# Patient Record
Sex: Male | Born: 1995 | Race: White | Hispanic: No | Marital: Single | Smoking: Never smoker
Health system: Southern US, Community
[De-identification: ages and names within clinical notes are randomized; demographics above are authoritative.]

## PROBLEM LIST (undated history)

## (undated) DIAGNOSIS — R011 Cardiac murmur, unspecified: Secondary | ICD-10-CM

## (undated) DIAGNOSIS — S83511A Sprain of anterior cruciate ligament of right knee, initial encounter: Secondary | ICD-10-CM

## (undated) DIAGNOSIS — R569 Unspecified convulsions: Secondary | ICD-10-CM

## (undated) DIAGNOSIS — H539 Unspecified visual disturbance: Secondary | ICD-10-CM

## (undated) DIAGNOSIS — E669 Obesity, unspecified: Secondary | ICD-10-CM

## (undated) HISTORY — PX: KNEE SURGERY: SHX244

---
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
## 2021-04-23 ENCOUNTER — Encounter (HOSPITAL_COMMUNITY): Payer: Self-pay

## 2021-04-23 ENCOUNTER — Emergency Department (HOSPITAL_COMMUNITY)
Admission: EM | Admit: 2021-04-23 | Discharge: 2021-04-23 | Disposition: A | Discharge status: Home / Self Care | Payer: No Typology Code available for payment source | Attending: Emergency Medicine | Admitting: Emergency Medicine

## 2021-04-23 ENCOUNTER — Other Ambulatory Visit: Payer: Self-pay

## 2021-04-23 DIAGNOSIS — Z23 Encounter for immunization: Secondary | ICD-10-CM | POA: Insufficient documentation

## 2021-04-23 DIAGNOSIS — Z7982 Long term (current) use of aspirin: Secondary | ICD-10-CM | POA: Insufficient documentation

## 2021-04-23 DIAGNOSIS — W260XXA Contact with knife, initial encounter: Secondary | ICD-10-CM | POA: Diagnosis not present

## 2021-04-23 DIAGNOSIS — S59912A Unspecified injury of left forearm, initial encounter: Secondary | ICD-10-CM | POA: Diagnosis present

## 2021-04-23 DIAGNOSIS — S51812A Laceration without foreign body of left forearm, initial encounter: Secondary | ICD-10-CM | POA: Insufficient documentation

## 2021-04-23 MED ORDER — POVIDONE-IODINE 10 % EX SOLN: CUTANEOUS | Status: DC | PRN

## 2021-04-23 MED ORDER — LIDOCAINE-EPINEPHRINE (PF) 2 %-1:200000 IJ SOLN
10.0000 mL | Freq: Once | INTRAMUSCULAR | Status: AC
  Administered 2021-04-23: 10 mL via INTRADERMAL
  Filled 2021-04-23: qty 20

## 2021-04-23 MED ORDER — TETANUS-DIPHTH-ACELL PERTUSSIS 5-2.5-18.5 LF-MCG/0.5 IM SUSY
0.5000 mL | PREFILLED_SYRINGE | Freq: Once | INTRAMUSCULAR | Status: AC
Start: 2021-04-23 — End: 2021-04-23
  Administered 2021-04-23: 0.5 mL via INTRAMUSCULAR
  Filled 2021-04-23: qty 0.5

## 2021-04-23 NOTE — ED Provider Notes (Signed)
?[facility] ?Provider Note ? ? ?CSN: [id number] ?Arrival date & time: 04/23/21  0856 ? ?  ? ?History ? ?Chief Complaint  ?Patient presents with  ? Laceration  ? ? ?Joel Garcia is a 26 y.o. male. ? ? ?Laceration ? ?  ? ? ?Joel Garcia is a 26 y.o. male who presents to the Emergency Department complaining of laceration of the left forearm that occurred shortly before ER arrival.  States this is a work-related injury.  He was using a knife when the knife slipped causing the laceration.  Bleeding controlled prior to arrival.  He complains of some soreness to his forearm area.  He denies any pain numbness or weakness of his wrist hand or fingers.  Last Td is unknown. ? ? ?Home Medications ?Prior to Admission medications   ?Medication Sig Start Date End Date Taking? Authorizing Provider  ?aspirin 325 MG tablet Take 1 tablet (325 mg total) by mouth daily. 12/09/14   Cammy Copa, MD  ?methocarbamol (ROBAXIN) 500 MG tablet Take 1 tablet (500 mg total) by mouth 4 (four) times daily. 12/09/14   Cammy Copa, MD  ?oxyCODONE (OXY IR/ROXICODONE) 5 MG immediate release tablet Take 1-2 tablets (5-10 mg total) by mouth every 3 (three) hours as needed for breakthrough pain. 12/09/14   Cammy Copa, MD  ?   ? ?Allergies    ?Patient has no known allergies.   ? ?Review of Systems   ?Review of Systems  ?Musculoskeletal:  Negative for arthralgias.  ?Skin:  Positive for wound.  ?     Laceration left forearm  ?Neurological:  Negative for weakness and numbness.  ?All other systems reviewed and are negative. ? ?Physical Exam ?Updated Vital Signs ?BP 140/89 (BP Location: Left Arm)   Pulse 90   Temp 98.5 ?F (36.9 ?C)   Resp 20   Ht 5\' 11"  (1.803 m)   Wt 113.4 kg   SpO2 98%   BMI 34.87 kg/m?  ?Physical Exam ?Vitals and nursing note reviewed.  ?Constitutional:   ?   General: He is not in acute distress. ?   Appearance: Normal appearance. He is not ill-appearing or toxic-appearing.  ?Cardiovascular:   ?   Rate and Rhythm: Normal rate and regular rhythm.  ?   Pulses: Normal pulses.  ?Pulmonary:  ?   Effort: Pulmonary effort is normal.  ?Musculoskeletal:     ?   General: Signs of injury present.  ?   Left hand: No tenderness or bony tenderness. Normal range of motion. Normal strength. Normal sensation. There is no disruption of two-point discrimination. Normal capillary refill. Normal pulse.  ?   Comments: FROM of all fingers of the left hand.  No tenderness with ROM of the left wrist.  Grip strength strong   ?Skin: ?   General: Skin is warm.  ?   Capillary Refill: Capillary refill takes less than 2 seconds.  ?   Comments: 8 cm laceration to the palmar aspect of the left forearm. No edema or active bleeding. Wound explored no foreign bodies.    ?Neurological:  ?   General: No focal deficit present.  ?   Mental Status: He is alert.  ?   Sensory: No sensory deficit.  ?   Motor: No weakness.  ? ? ?ED Results / Procedures / Treatments   ?Labs ?(all labs ordered are listed, but only abnormal results are displayed) ?Labs Reviewed - No data to display ? ?EKG ?None ? ?Radiology ?No results  found. ? ?Procedures ?Procedures  ? ? ? ?LACERATION REPAIR ?Performed by: Lekita Kerekes ?Authorized by: Aylee Littrell ?Consent: Verbal consent obtained. ?Risks and benefits: risks, benefits and alternatives were discussed ?Consent given by: patient ?Patient identity confirmed: provided demographic data ?Prepped and Draped in normal sterile fashion ?Wound explored ? ?Laceration Location: left forearm ? ?Laceration Length: 8 cm ? ?No Foreign Bodies seen or palpated ? ?Anesthesia: local infiltration ? ?Local anesthetic: lidocaine 2% with epinephrine ? ?Anesthetic total: 6 ml ? ?Irrigation method: syringe ?Amount of cleaning: standard ? ?Skin closure: prolene ? ?Number of sutures: 11 ? ?Technique: simple interrupted ? ?Patient tolerance: Patient tolerated the procedure well with no immediate complications. ? ?Medications Ordered in  ED ?Medications  ?lidocaine-EPINEPHrine (XYLOCAINE W/EPI) 2 %-1:200000 (PF) injection 10 mL (has no administration in time range)  ?povidone-iodine (BETADINE) 10 % external solution (has no administration in time range)  ?Tdap (BOOSTRIX) injection 0.5 mL (has no administration in time range)  ? ? ?ED Course/ Medical Decision Making/ A&P ?  ?                        ?Medical Decision Making ?Risk ?OTC drugs. ?Prescription drug management. ? ? ?Laceration left forearm bleeding controlled prior to arrival.  Work related injury.  Wound explored through range of motion and through entire depth.  No foreign body seen.  Skin well approximated with sutures.  TD updated.  Dressing applied.  Patient given wound care instructions.  Sutures out in 8 to 10 days. ? ? ? ? ? ? ? ?Final Clinical Impression(s) / ED Diagnoses ?Final diagnoses:  ?Laceration of left forearm, initial encounter  ? ? ?Rx / DC Orders ?ED Discharge Orders   ? ? None  ? ?  ? ? ?  ?Pauline Aus, PA-C ?04/23/21 1349 ? ?  ?Mancel Bale, MD ?04/24/21 2044 ? ?

## 2021-04-23 NOTE — ED Notes (Signed)
Sutures in place to left forearm by provider. Wrapped with non adherent gauze. No active bleeding.  ?

## 2021-04-23 NOTE — Discharge Instructions (Signed)
Keep the wound clean with mild soap and water and keep it bandaged.  Sutures out in 8 to 10 days.  You may keep your arm elevated today to help with swelling.  Tylenol or ibuprofen if needed for pain.  Return to the emergency department for any new or worsening symptoms or signs of infection. ?

## 2021-04-23 NOTE — ED Triage Notes (Signed)
"  At work and sliced left forearm on machine" per patient. Gauze in place at this time, bleeding controlled ?

## 2021-07-24 ENCOUNTER — Ambulatory Visit
Admission: EM | Admit: 2021-07-24 | Discharge: 2021-07-24 | Disposition: A | Discharge status: Home / Self Care | Payer: BC Managed Care – PPO | Attending: Nurse Practitioner | Admitting: Nurse Practitioner

## 2021-07-24 DIAGNOSIS — J209 Acute bronchitis, unspecified: Secondary | ICD-10-CM | POA: Diagnosis not present

## 2021-07-24 DIAGNOSIS — J22 Unspecified acute lower respiratory infection: Secondary | ICD-10-CM | POA: Diagnosis not present

## 2021-07-24 MED ORDER — PSEUDOEPH-BROMPHEN-DM 30-2-10 MG/5ML PO SYRP: 5.0000 mL | ORAL_SOLUTION | Freq: Four times a day (QID) | ORAL | 0 refills | Status: DC | PRN

## 2021-07-24 MED ORDER — AZITHROMYCIN 250 MG PO TABS: 250.0000 mg | ORAL_TABLET | Freq: Every day | ORAL | 0 refills | Status: DC

## 2021-07-24 NOTE — ED Triage Notes (Signed)
Pt reports chest congestion and cough x 2 weeks. He is taking OTC medication, but has not help.

## 2021-07-24 NOTE — ED Provider Notes (Signed)
[facility]    CSN: [id number] Arrival date & time: 07/24/21  1046      History   Chief Complaint No chief complaint on file.   HPI Joel Garcia is a 26 y.o. male.   The history is provided by the patient.   Presents for complaints of cough and chest congestion has been present for the past 2 weeks.  Patient states cough is productive of yellowish-green sputum at this time.  He also states that the cough can worsen to the point where he has chest tightness.  He denies fever, chills, runny nose, wheezing, shortness of breath, difficulty breathing, or GI symptoms.  Patient states that he has been using DayQuil, NyQuil, and over-the-counter allergy medicine for his symptoms with no relief.  Patient states that he uses a CPAP at bedtime.  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   KNEE SURGERY         Home Medications    Prior to Admission medications   Medication Sig Start Date End Date Taking? Authorizing Provider  azithromycin (ZITHROMAX) 250 MG tablet Take 1 tablet (250 mg total) by mouth daily. Take first 2 tablets together, then 1 every day until finished. 07/24/21  Yes Carleigh Buccieri-Warren, Sadie Haber, NP  brompheniramine-pseudoephedrine-DM 30-2-10 MG/5ML syrup Take 5 mLs by mouth 4 (four) times daily as needed. 07/24/21  Yes  Dellia Donnelly-Warren, Sadie Haber, NP  aspirin 325 MG tablet Take 1 tablet (325 mg total) by mouth daily. 12/09/14   Cammy Copa, MD  methocarbamol (ROBAXIN) 500 MG tablet Take 1 tablet (500 mg total) by mouth 4 (four) times daily. 12/09/14   Cammy Copa, MD  oxyCODONE (OXY IR/ROXICODONE) 5 MG immediate release tablet Take 1-2 tablets (5-10 mg total) by mouth every 3 (three) hours as needed for breakthrough pain. 12/09/14   Cammy Copa, MD    Family History Family History  Problem Relation Age of Onset   Hyperlipidemia Maternal Grandfather    Diabetes Paternal Grandmother     Social History Social History   Tobacco Use   Smoking status: Never   Smokeless tobacco: Never  Substance Use Topics   Alcohol use: No   Drug use: No     Allergies   Patient has no known allergies.   Review of Systems Review of Systems Per HPI  Physical Exam Triage Vital Signs ED Triage Vitals [07/24/21 1114]  Enc Vitals Group     BP (!) 148/102     Pulse Rate 92     Resp 16     Temp 98.2 F (36.8 C)     Temp Source Oral     SpO2 99 %     Weight      Height      Head Circumference      Peak  Flow      Pain Score 0     Pain Loc      Pain Edu?      Excl. in GC?    No data found.  Updated Vital Signs BP (!) 148/102 (BP Location: Left Arm)   Pulse 92   Temp 98.2 F (36.8 C) (Oral)   Resp 16   SpO2 99%   Visual Acuity Right Eye Distance:   Left Eye Distance:   Bilateral Distance:    Right Eye Near:   Left Eye Near:    Bilateral Near:     Physical Exam Vitals and nursing note reviewed.  Constitutional:      General: He is not in acute distress.    Appearance: Normal appearance. He is well-developed.  HENT:     Head: Normocephalic and atraumatic.     Right Ear: Tympanic membrane, ear canal and external ear normal.     Left Ear: Tympanic membrane, ear canal and external ear normal.     Nose: Congestion present.     Right Turbinates: Enlarged and swollen.      Left Turbinates: Enlarged and swollen.     Right Sinus: No maxillary sinus tenderness or frontal sinus tenderness.     Left Sinus: No maxillary sinus tenderness or frontal sinus tenderness.     Mouth/Throat:     Mouth: Mucous membranes are moist.     Pharynx: Posterior oropharyngeal erythema present.  Eyes:     Conjunctiva/sclera: Conjunctivae normal.     Pupils: Pupils are equal, round, and reactive to light.  Neck:     Thyroid: No thyromegaly.     Trachea: No tracheal deviation.  Cardiovascular:     Rate and Rhythm: Normal rate and regular rhythm.     Pulses: Normal pulses.     Heart sounds: Normal heart sounds.  Pulmonary:     Effort: Pulmonary effort is normal. No respiratory distress.     Breath sounds: Normal breath sounds. No stridor. No wheezing, rhonchi or rales.  Chest:     Chest wall: No tenderness.  Abdominal:     General: Bowel sounds are normal. There is no distension.     Palpations: Abdomen is soft.     Tenderness: There is no abdominal tenderness.  Musculoskeletal:     Cervical back: Normal range of motion and neck supple.  Skin:    General: Skin is warm and dry.  Neurological:     General: No focal deficit present.     Mental Status: He is alert and oriented to person, place, and time.  Psychiatric:        Mood and Affect: Mood normal.        Behavior: Behavior normal.        Thought Content: Thought content normal.        Judgment: Judgment normal.      UC Treatments / Results  Labs (all labs ordered are listed, but only abnormal results are displayed) Labs Reviewed - No data to display  EKG   Radiology No results found.  Procedures Procedures (including critical care time)  Medications Ordered in UC Medications - No data to display  Initial Impression / Assessment and Plan / UC Course  I have reviewed the triage vital signs and the nursing notes.  Pertinent labs & imaging results that were available during my care of the patient were  reviewed by me and considered in my medical decision making (see chart for details).  Patient presents  for cough and chest congestion has been present for the past 2 weeks.  On exam, patient's lung sounds are clear.  His blood pressure is elevated, but vitals are otherwise stable.  He is in no acute distress.  We will start patient on azithromycin to cover for bacterial infection.  Patient has been using over-the-counter cough medicines with no relief as well.  Also cough has been present for the past 2 weeks.  Supportive care recommendations were provided to the patient.  Patient advised to follow-up if symptoms worsen or do not improve. Final Clinical Impressions(s) / UC Diagnoses   Final diagnoses:  Acute lower respiratory infection  Acute bronchitis, unspecified organism     Discharge Instructions      Take medication as prescribed. Increase fluids and allow for plenty of rest. Recommend Tylenol or ibuprofen as needed for pain, fever, or general discomfort. Recommend using a humidifier at bedtime during sleep to help with cough and nasal congestion. Sleep elevated on 2 pillows. Follow-up if your symptoms worsen or do not improve.      ED Prescriptions     Medication Sig Dispense Auth. Provider   azithromycin (ZITHROMAX) 250 MG tablet Take 1 tablet (250 mg total) by mouth daily. Take first 2 tablets together, then 1 every day until finished. 6 tablet Mora Pedraza-Warren, Sadie Haber, NP   brompheniramine-pseudoephedrine-DM 30-2-10 MG/5ML syrup Take 5 mLs by mouth 4 (four) times daily as needed. 140 mL Konstantinos Cordoba-Warren, Sadie Haber, NP      PDMP not reviewed this encounter.   Abran Cantor, NP 07/24/21 [phone]

## 2021-07-24 NOTE — Discharge Instructions (Addendum)
Take medication as prescribed. Increase fluids and allow for plenty of rest. Recommend Tylenol or ibuprofen as needed for pain, fever, or general discomfort. Recommend using a humidifier at bedtime during sleep to help with cough and nasal congestion. Sleep elevated on 2 pillows. Follow-up if your symptoms worsen or do not improve.

## 2021-12-26 ENCOUNTER — Ambulatory Visit
Admission: EM | Admit: 2021-12-26 | Discharge: 2021-12-26 | Disposition: A | Discharge status: Home / Self Care | Payer: BC Managed Care – PPO

## 2021-12-26 DIAGNOSIS — K148 Other diseases of tongue: Secondary | ICD-10-CM | POA: Diagnosis not present

## 2021-12-26 MED ORDER — CHLORHEXIDINE GLUCONATE 0.12 % MT SOLN: 15.0000 mL | Freq: Two times a day (BID) | OROMUCOSAL | 0 refills | Status: DC

## 2021-12-26 MED ORDER — LIDOCAINE VISCOUS HCL 2 % MT SOLN: 10.0000 mL | OROMUCOSAL | 0 refills | Status: DC | PRN

## 2021-12-26 NOTE — ED Provider Notes (Signed)
[facility]    CSN: [id number] Arrival date & time: 12/26/21  1543      History   Chief Complaint Chief Complaint  Patient presents with   Mouth Lesions    HPI Joel Garcia is a 26 y.o. male.   Presenting today with a large tongue lesion that he noted 2 days ago.  Denies any known injury to the area, new foods or medications.  States it is very painful but not bleeding or draining.  Trying to eat a soft diet to avoid further irritation but not taking any medications for symptoms.  No past history of similar issues.    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   KNEE SURGERY         Home Medications    Prior to Admission medications   Medication Sig Start Date End Date Taking? Authorizing Provider  BRIVIACT 50 MG TABS 25mg  AM + 125mg  PM 11/01/21  Yes [provider]  chlorhexidine (PERIDEX) 0.12 % solution Use as directed 15 mLs in the mouth or throat 2 (two) times daily. 12/26/21  Yes 11/03/21, PA-C  lidocaine (XYLOCAINE) 2 % solution Use as directed 10 mLs in the mouth or throat every 3 (three) hours as needed for mouth pain. Gargle or dab onto area 12/26/21  Yes Particia Nearing, PA-C  aspirin 325 MG tablet Take 1 tablet (325 mg total) by mouth daily. 12/09/14   Particia Nearing, MD  azithromycin (ZITHROMAX) 250 MG tablet Take 1 tablet (250 mg total) by mouth daily. Take first 2 tablets together, then 1 every day until finished. 07/24/21   Leath-Warren, Cammy Copa, NP  brompheniramine-pseudoephedrine-DM 30-2-10 MG/5ML syrup Take 5 mLs by mouth 4 (four) times daily as needed. 07/24/21   Leath-Warren, 01-12-1971, NP  methocarbamol (ROBAXIN) 500 MG tablet Take 1 tablet (500 mg total) by mouth 4 (four) times daily. 12/09/14   Sadie Haber, MD  oxyCODONE (OXY IR/ROXICODONE) 5 MG immediate release tablet Take 1-2 tablets (5-10 mg total) by mouth every 3 (three) hours as needed for breakthrough pain. 12/09/14   Cammy Copa, MD    Family History Family History  Problem Relation Age of Onset   Hyperlipidemia Maternal Grandfather    Diabetes Paternal Grandmother     Social History Social History   Tobacco Use   Smoking status: Never   Smokeless tobacco: Never  Substance Use Topics   Alcohol use: No   Drug use: No     Allergies   Patient has no known allergies.   Review of Systems Review of Systems PER HPI  Physical Exam Triage Vital Signs ED Triage Vitals  Enc Vitals Group     BP 12/26/21 1726 (!) 148/88     Pulse Rate 12/26/21 1726 61  Resp 12/26/21 1726 16     Temp 12/26/21 1726 97.9 F (36.6 C)     Temp Source 12/26/21 1726 Oral     SpO2 12/26/21 1726 97 %     Weight --      Height --      Head Circumference --      Peak Flow --      Pain Score 12/26/21 1724 8     Pain Loc --      Pain Edu? --      Excl. in GC? --    No data found.  Updated Vital Signs BP (!) 148/88 (BP Location: Right Arm)   Pulse 61   Temp 97.9 F (36.6 C) (Oral)   Resp 16   SpO2 97%   Visual Acuity Right Eye Distance:   Left Eye Distance:   Bilateral Distance:    Right Eye Near:   Left Eye Near:    Bilateral Near:     Physical Exam Vitals and nursing note reviewed.  Constitutional:      Appearance: Normal appearance.  HENT:      Head: Atraumatic.     Mouth/Throat:     Mouth: Mucous membranes are moist.     Comments: 1 cm ulceration to center of tongue toward the distal end, no active drainage, bleeding, erythema Eyes:     Extraocular Movements: Extraocular movements intact.     Conjunctiva/sclera: Conjunctivae normal.  Cardiovascular:     Rate and Rhythm: Normal rate and regular rhythm.  Pulmonary:     Effort: Pulmonary effort is normal.     Breath sounds: Normal breath sounds.  Musculoskeletal:        General: Normal range of motion.     Cervical back: Normal range of motion and neck supple.  Skin:    General: Skin is warm and dry.  Neurological:     General: No focal deficit present.     Mental Status: He is oriented to person, place, and time.  Psychiatric:        Mood and Affect: Mood normal.        Thought Content: Thought content normal.        Judgment: Judgment normal.    UC Treatments / Results  Labs (all labs ordered are listed, but only abnormal results are displayed) Labs Reviewed - No data to display  EKG  Radiology No results found.  Procedures Procedures (including critical care time)  Medications Ordered in UC Medications - No data to display  Initial Impression / Assessment and Plan / UC Course  I have reviewed the triage vital signs and the nursing notes.  Pertinent labs & imaging results that were available during my care of the patient were reviewed by me and considered in my medical decision making (see chart for details).     Vital signs and exam overall reassuring but does have a sizable lesion to the center of the dorsal surface of his tongue.  Will treat with Peridex rinse, viscous lidocaine, multivitamins, probiotics and soft, not irritating foods.  Dental follow-up if not resolving.  Final Clinical Impressions(s) / UC Diagnoses   Final diagnoses:  Tongue lesion   Discharge Instructions   None    ED Prescriptions     Medication Sig Dispense Auth.  Provider   lidocaine (XYLOCAINE) 2 % solution Use as directed 10 mLs in the mouth or throat every 3 (three) hours as needed for mouth pain. Gargle or dab onto area 100 mL  Particia Nearing, PA-C   chlorhexidine (PERIDEX) 0.12 % solution Use as directed 15 mLs in the mouth or throat 2 (two) times daily. 120 mL Particia Nearing, [place]      PDMP not reviewed this encounter.   Particia Nearing, [place] 12/26/21 1742

## 2021-12-26 NOTE — ED Triage Notes (Signed)
Patient presents to UC for oral lesion noted 2 days ago. States located on tongue. Treating by eating a soft diet.

## 2022-07-14 ENCOUNTER — Ambulatory Visit
Admission: EM | Admit: 2022-07-14 | Discharge: 2022-07-14 | Disposition: A | Discharge status: Home / Self Care | Payer: BC Managed Care – PPO

## 2022-07-14 ENCOUNTER — Ambulatory Visit (INDEPENDENT_AMBULATORY_CARE_PROVIDER_SITE_OTHER): Payer: BC Managed Care – PPO

## 2022-07-14 ENCOUNTER — Encounter: Payer: Self-pay | Admitting: Emergency Medicine

## 2022-07-14 DIAGNOSIS — M25572 Pain in left ankle and joints of left foot: Secondary | ICD-10-CM

## 2022-07-14 MED ORDER — PREDNISONE 50 MG PO TABS: ORAL_TABLET | ORAL | 0 refills | Status: DC

## 2022-07-14 NOTE — ED Provider Notes (Signed)
[facility]    CSN: [id number] Arrival date & time: 07/14/22  0820      History   Chief Complaint No chief complaint on file.   HPI Joel Garcia is a 27 y.o. male.   Patient presenting today with 3 to 4-week history of left anterior ankle pain that is intermittent but became more consistent the past few days.  He states the pain is worse with certain movements, such as rotating his ankle in a specific direction or walking.  He denies any injury prior to onset of symptoms and has not noticed any swelling or discoloration at any time.  Denies numbness, tingling, decreased range of motion, history of ankle issues.  Has been trying a brace and over-the-counter pain relievers with minimal relief.   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   KNEE SURGERY         Home Medications    Prior to Admission medications   Medication Sig Start Date End Date Taking? Authorizing Provider  cetirizine (ZYRTEC) 10 MG chewable tablet Chew 10 mg by mouth daily.   Yes [provider]  predniSONE (DELTASONE) 50 MG tablet Take 1 tablet daily with breakfast for 3 days. 07/14/22  Yes Particia Nearing, PA-C  BRIVIACT 50 MG TABS 25mg  AM + 125mg  PM 11/01/21   [provider]    Family History Family History  Problem  Relation Age of Onset   Hyperlipidemia Maternal Grandfather    Diabetes Paternal Grandmother     Social History Social History   Tobacco Use   Smoking status: Never   Smokeless tobacco: Never  Vaping Use   Vaping Use: Never used  Substance Use Topics   Alcohol use: No   Drug use: No     Allergies   Patient has no known allergies.   Review of Systems Review of Systems Per HPI  Physical Exam Triage Vital Signs ED Triage Vitals  Enc Vitals Group     BP 07/14/22 0910 136/82     Pulse Rate 07/14/22 0910 87     Resp 07/14/22 0910 18     Temp 07/14/22 0910 98.5 F (36.9 C)     Temp Source 07/14/22 0910 Oral     SpO2 07/14/22 0910 97 %     Weight --      Height --      Head Circumference --      Peak Flow --      Pain Score 07/14/22 0911 5     Pain Loc --      Pain Edu? --      Excl. in GC? --    No data found.  Updated Vital Signs BP 136/82 (BP Location: Right Arm)   Pulse 87   Temp 98.5 F (36.9 C) (Oral)  Resp 18   SpO2 97%   Visual Acuity Right Eye Distance:   Left Eye Distance:   Bilateral Distance:    Right Eye Near:   Left Eye Near:    Bilateral Near:     Physical Exam Vitals and nursing note reviewed.  Constitutional:      Appearance: Normal appearance.  HENT:     Head: Atraumatic.  Eyes:     Extraocular Movements: Extraocular movements intact.     Conjunctiva/sclera: Conjunctivae normal.  Cardiovascular:     Rate and Rhythm: Normal rate and regular rhythm.     Pulses: Normal pulses.  Pulmonary:     Effort: Pulmonary effort is normal.     Breath sounds: Normal breath sounds.  Musculoskeletal:        General: No swelling, tenderness, deformity or signs of injury. Normal range of motion.     Cervical back: Normal range of motion and neck supple.     Right lower leg: No edema.     Left lower leg: No edema.     Comments: Left ankle not edematous or erythematous, no point tenderness to palpation, normal range of motion and gait noted   Skin:    General: Skin is warm and dry.  Neurological:     General: No focal deficit present.     Mental Status: He is oriented to person, place, and time.     Sensory: No sensory deficit.     Motor: No weakness.     Gait: Gait normal.  Psychiatric:        Mood and Affect: Mood normal.        Thought Content: Thought content normal.        Judgment: Judgment normal.      UC Treatments / Results  Labs (all labs ordered are listed, but only abnormal results are displayed) Labs Reviewed - No data to display  EKG   Radiology DG Ankle Complete Left  Result Date: 07/14/2022 CLINICAL DATA:  Pain after fall 3-4 weeks ago. EXAM: LEFT ANKLE COMPLETE - 3 VIEW COMPARISON:  None Available. FINDINGS: No fracture or dislocation. Preserved joint spaces and bone mineralization. Small well-corticated Achilles calcaneal spur. IMPRESSION: No acute osseous abnormality. Electronically Signed   By: Karen Kays M.D.   On: 07/14/2022 09:51    Procedures Procedures (including critical care time)  Medications Ordered in UC Medications - No data to display  Initial Impression / Assessment and Plan / UC Course  I have reviewed the triage vital signs and the nursing notes.  Pertinent labs & imaging results that were available during my care of the patient were reviewed by me and considered in my medical decision making (see chart for details).     X-ray of the left ankle negative for acute bony abnormality.  Suspect soft tissue strain causing symptoms.  Continue RICE protocol, over-the-counter pain relievers and short burst of prednisone to help given duration of symptoms and poor response to supportive care alone.  Ortho follow-up if not resolving at that point.  Final Clinical Impressions(s) / UC Diagnoses   Final diagnoses:  Acute left ankle pain     Discharge Instructions      Your ankle x-ray today does not show any bony abnormalities.  I suspect a soft tissue cause such as muscle  strain or tendinitis.  I have sent over a short burst of prednisone to help with any inflammation and you may continue a supportive brace, stretches, Epsom salt soaks, elevation, ice  and over-the-counter pain relievers as needed.    ED Prescriptions     Medication Sig Dispense Auth. Provider   predniSONE (DELTASONE) 50 MG tablet Take 1 tablet daily with breakfast for 3 days. 3 tablet Particia Nearing, [place]      PDMP not reviewed this encounter.   Particia Nearing, [place] 07/14/22 1029

## 2022-07-14 NOTE — Discharge Instructions (Signed)
Your ankle x-ray today does not show any bony abnormalities.  I suspect a soft tissue cause such as muscle strain or tendinitis.  I have sent over a short burst of prednisone to help with any inflammation and you may continue a supportive brace, stretches, Epsom salt soaks, elevation, ice and over-the-counter pain relievers as needed.

## 2022-07-14 NOTE — ED Triage Notes (Signed)
Left ankle pain x 3 to 4 weeks.  No known injury.  Has been wearing an ankle brace that does help with pain.

## 2023-10-26 ENCOUNTER — Ambulatory Visit

## 2023-10-26 ENCOUNTER — Other Ambulatory Visit: Payer: Self-pay

## 2023-10-26 ENCOUNTER — Ambulatory Visit: Admission: EM | Admit: 2023-10-26 | Discharge: 2023-10-26 | Disposition: A | Discharge status: Home / Self Care

## 2023-10-26 ENCOUNTER — Encounter: Payer: Self-pay | Admitting: Emergency Medicine

## 2023-10-26 DIAGNOSIS — M79672 Pain in left foot: Secondary | ICD-10-CM

## 2023-10-26 MED ORDER — PREDNISONE 20 MG PO TABS: 40.0000 mg | ORAL_TABLET | Freq: Every day | ORAL | 0 refills | Status: AC

## 2023-10-26 NOTE — Discharge Instructions (Addendum)
 The x-ray was negative for fracture or dislocation. Take medication as prescribed.  You may take Tylenol  while you are taking the prednisone .  Once you complete the prednisone , you may take ibuprofen  as needed. RICE therapy, rest, ice, compression, and elevation.  Apply ice for 20 minutes, remove for 1 hour, repeat as needed while symptoms persist.  Recommend freezing a water bottle and rolling it under the left foot while symptoms persist. Recommend warm Epsom salt soaks as needed for pain or discomfort. Make sure you are wearing shoes with good insoles and support. I have provided exercises for you to perform at least 2-3 times daily while symptoms persist. If your symptoms fail to improve with this treatment, recommend follow-up with orthopedics or with podiatry for further evaluation. Follow-up as needed.

## 2023-10-26 NOTE — ED Provider Notes (Signed)
 [facility]    CSN: [id number] Arrival date & time: 10/26/23  1600      History   Chief Complaint Chief Complaint  Patient presents with   Foot Pain    HPI Joel Garcia is a 28 y.o. male.   The history is provided by the patient.   Patient presents for complaints of left foot pain has been present for the past several months.  Patient states that the pain is on the outside of his foot.  He states that he has not experienced any injury or trauma.  He does inform that he wears steel toed boots at work, works 12-hour days, and walks on concrete flooring all day.  Patient states that he has not had any injury, trauma, numbness, tingling, radiation of pain, or the inability to bear weight.  He states that he has been using something to help with his arch support with minimal relief.  States that he has taken ibuprofen  on a few occasions with some relief of his symptoms.  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   KNEE SURGERY         Home Medications    Prior to Admission medications   Medication Sig Start Date End Date Taking? Authorizing Provider  pyridoxine (B-6) 100 MG tablet Take 100 mg by mouth. 01/25/21  Yes [provider]  BRIVIACT 50 MG TABS 25mg  AM + 125mg  PM 11/01/21   [provider]   cetirizine (ZYRTEC) 10 MG chewable tablet Chew 10 mg by mouth daily.    [provider]  predniSONE  (DELTASONE ) 50 MG tablet Take 1 tablet daily with breakfast for 3 days. 07/14/22   Stuart Vernell Norris, PA-C    Family History Family History  Problem Relation Age of Onset   Hyperlipidemia Maternal Grandfather    Diabetes Paternal Grandmother     Social History Social History   Tobacco Use   Smoking status: Never   Smokeless tobacco: Never  Vaping Use   Vaping status: Never Used  Substance Use Topics   Alcohol use: No   Drug use: No     Allergies   Patient has no known allergies.   Review of Systems Review of Systems Per HPI  Physical Exam Triage Vital Signs ED Triage Vitals  Encounter Vitals Group     BP 10/26/23 1647 (!) 148/98     Girls Systolic BP Percentile --      Girls Diastolic BP Percentile --      Boys Systolic BP Percentile --      Boys Diastolic BP Percentile --      Pulse Rate 10/26/23 1647 74     Resp 10/26/23 1647 20     Temp 10/26/23 1647 98.9 F (37.2 C)     Temp Source 10/26/23 1647 Oral  SpO2 10/26/23 1647 96 %     Weight --      Height --      Head Circumference --      Peak Flow --      Pain Score 10/26/23 1646 0     Pain Loc --      Pain Education --      Exclude from Growth Chart --    No data found.  Updated Vital Signs BP (!) 148/98 (BP Location: Right Arm)   Pulse 74   Temp 98.9 F (37.2 C) (Oral)   Resp 20   SpO2 96%   Visual Acuity Right Eye Distance:   Left Eye Distance:   Bilateral Distance:    Right Eye Near:   Left Eye Near:    Bilateral Near:     Physical Exam Vitals and nursing note reviewed.  Constitutional:      General: He is not in acute distress.    Appearance: Normal appearance.  HENT:     Head: Normocephalic.  Pulmonary:     Effort: Pulmonary effort is normal.  Musculoskeletal:     Cervical back: Normal range of motion.     Left foot: Normal range of motion. Tenderness  present. No swelling or deformity.     Comments: Left foot is without bruising, swelling, or obvious deformity.  Point tenderness noted to the lateral aspect of the fifth metatarsal along the plantar fascia.  Feet:     Left foot:     Skin integrity: Skin integrity normal.     Toenail Condition: Left toenails are normal.  Skin:    General: Skin is warm and dry.  Neurological:     General: No focal deficit present.     Mental Status: He is alert and oriented to person, place, and time.  Psychiatric:        Mood and Affect: Mood normal.        Behavior: Behavior normal.      UC Treatments / Results  Labs (all labs ordered are listed, but only abnormal results are displayed) Labs Reviewed - No data to display  EKG   Radiology No results found.  Procedures Procedures (including critical care time)  Medications Ordered in UC Medications - No data to display  Initial Impression / Assessment and Plan / UC Course  I have reviewed the triage vital signs and the nursing notes.  Pertinent labs & imaging results that were available during my care of the patient were reviewed by me and considered in my medical decision making (see chart for details).  X-ray of the left foot is negative for fracture or dislocation.  Differential diagnoses include tendinopathy or plantar fasciitis.  Will treat with prednisone  40 mg for the next 5 days.  Supportive care recommendations were provided and discussed with the patient to include wearing shoes with good insole and support, over-the-counter Tylenol  for pain or discomfort, and freezing a water bottle and rolling it under the foot.  Patient was given indications regarding follow-up with podiatry or with orthopedics.  Patient was in agreement with this plan of care and verbalizes understanding.  All questions were answered.  Patient stable for discharge.   Final Clinical Impressions(s) / UC Diagnoses   Final diagnoses:  Left foot pain    Discharge Instructions   None    ED Prescriptions   None    PDMP not reviewed this encounter.   Gilmer Etta PARAS, NP 10/26/23 [phone]

## 2023-10-26 NOTE — ED Triage Notes (Signed)
 Pt reports intermittent left lateral foot pain for last several months. Pt reports has tried arch supports with minimal relief of discomfort. Denies any known injury or surgery on LLE. Pt able to bear weight. Steady gait noted in triage.
